# Patient Record
Sex: Male | Born: 1945 | Race: Black or African American | Hispanic: No | Marital: Single | Smoking: Never smoker
Health system: Southern US, Community
[De-identification: ages and names within clinical notes are randomized; demographics above are authoritative.]

## PROBLEM LIST (undated history)

## (undated) HISTORY — PX: ABDOMINAL HERNIA REPAIR: SHX539

---

## 2019-06-18 ENCOUNTER — Ambulatory Visit: Payer: Self-pay

## 2021-03-18 ENCOUNTER — Emergency Department
Admission: EM | Admit: 2021-03-18 | Discharge: 2021-03-18 | Disposition: A | Discharge status: Home / Self Care | Payer: No Typology Code available for payment source | Attending: Emergency Medicine | Admitting: Emergency Medicine

## 2021-03-18 DIAGNOSIS — Y92410 Unspecified street and highway as the place of occurrence of the external cause: Secondary | ICD-10-CM | POA: Diagnosis not present

## 2021-03-18 DIAGNOSIS — S8011XA Contusion of right lower leg, initial encounter: Secondary | ICD-10-CM | POA: Insufficient documentation

## 2021-03-18 DIAGNOSIS — S8991XA Unspecified injury of right lower leg, initial encounter: Secondary | ICD-10-CM | POA: Diagnosis present
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
## 2021-04-05 ENCOUNTER — Emergency Department: Payer: Non-veteran care

## 2021-04-05 ENCOUNTER — Emergency Department
Admission: EM | Admit: 2021-04-05 | Discharge: 2021-04-05 | Disposition: A | Discharge status: Home / Self Care | Payer: Non-veteran care | Attending: Emergency Medicine | Admitting: Emergency Medicine

## 2021-04-05 ENCOUNTER — Other Ambulatory Visit: Payer: Self-pay

## 2021-04-05 DIAGNOSIS — M79604 Pain in right leg: Secondary | ICD-10-CM | POA: Diagnosis not present

## 2021-04-05 DIAGNOSIS — Y92410 Unspecified street and highway as the place of occurrence of the external cause: Secondary | ICD-10-CM | POA: Insufficient documentation

## 2021-04-05 DIAGNOSIS — S39002D Unspecified injury of muscle, fascia and tendon of lower back, subsequent encounter: Secondary | ICD-10-CM | POA: Diagnosis present

## 2021-04-05 DIAGNOSIS — S39012D Strain of muscle, fascia and tendon of lower back, subsequent encounter: Secondary | ICD-10-CM | POA: Insufficient documentation

## 2021-04-05 DIAGNOSIS — S39012A Strain of muscle, fascia and tendon of lower back, initial encounter: Secondary | ICD-10-CM
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
## 2021-04-05 NOTE — ED Triage Notes (Signed)
Pt ambulatory with a steady gait
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
## 2021-04-05 NOTE — ED Notes (Signed)
Patient transported to X-ray 

## 2021-04-12 ENCOUNTER — Emergency Department: Payer: No Typology Code available for payment source

## 2021-04-12 ENCOUNTER — Other Ambulatory Visit: Payer: Self-pay

## 2021-04-12 ENCOUNTER — Encounter: Payer: Self-pay | Admitting: Emergency Medicine

## 2021-04-12 ENCOUNTER — Emergency Department
Admission: EM | Admit: 2021-04-12 | Discharge: 2021-04-12 | Disposition: A | Discharge status: Home / Self Care | Payer: No Typology Code available for payment source | Attending: Emergency Medicine | Admitting: Emergency Medicine

## 2021-04-12 DIAGNOSIS — M545 Low back pain, unspecified: Secondary | ICD-10-CM | POA: Diagnosis not present

## 2021-04-12 DIAGNOSIS — M79604 Pain in right leg: Secondary | ICD-10-CM | POA: Diagnosis not present

## 2021-04-12 DIAGNOSIS — Y92410 Unspecified street and highway as the place of occurrence of the external cause: Secondary | ICD-10-CM | POA: Diagnosis not present

## 2021-04-12 MED ORDER — MUPIROCIN 2 % EX OINT: 1.0000 "application " | TOPICAL_OINTMENT | Freq: Two times a day (BID) | CUTANEOUS | 0 refills | Status: AC

## 2021-04-12 MED ORDER — NAPROXEN 500 MG PO TABS: 500.0000 mg | ORAL_TABLET | Freq: Two times a day (BID) | ORAL | 0 refills | Status: AC

## 2021-04-12 MED ORDER — LIDOCAINE 5 % EX PTCH: 1.0000 | MEDICATED_PATCH | Freq: Two times a day (BID) | CUTANEOUS | 0 refills | Status: AC | PRN
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
## 2022-04-29 ENCOUNTER — Emergency Department: Payer: No Typology Code available for payment source

## 2022-04-29 ENCOUNTER — Emergency Department
Admission: EM | Admit: 2022-04-29 | Discharge: 2022-04-29 | Disposition: A | Discharge status: Home / Self Care | Payer: No Typology Code available for payment source | Attending: Emergency Medicine | Admitting: Emergency Medicine

## 2022-04-29 ENCOUNTER — Other Ambulatory Visit: Payer: Self-pay

## 2022-04-29 DIAGNOSIS — M25561 Pain in right knee: Secondary | ICD-10-CM | POA: Insufficient documentation

## 2022-04-29 DIAGNOSIS — S0993XA Unspecified injury of face, initial encounter: Secondary | ICD-10-CM | POA: Diagnosis present

## 2022-04-29 DIAGNOSIS — M25511 Pain in right shoulder: Secondary | ICD-10-CM | POA: Diagnosis not present

## 2022-04-29 DIAGNOSIS — R519 Headache, unspecified: Secondary | ICD-10-CM | POA: Diagnosis not present

## 2022-04-29 DIAGNOSIS — S01511A Laceration without foreign body of lip, initial encounter: Secondary | ICD-10-CM | POA: Insufficient documentation

## 2022-04-29 DIAGNOSIS — Y92481 Parking lot as the place of occurrence of the external cause: Secondary | ICD-10-CM | POA: Insufficient documentation

## 2022-04-29 MED ORDER — HYDROCODONE-ACETAMINOPHEN 5-325 MG PO TABS: 1.0000 | ORAL_TABLET | Freq: Four times a day (QID) | ORAL | 0 refills | Status: AC | PRN

## 2022-04-29 MED ORDER — LIDOCAINE-EPINEPHRINE-TETRACAINE (LET) TOPICAL GEL
3.0000 mL | Freq: Once | TOPICAL | Status: DC
  Filled 2022-04-29: qty 3

## 2022-04-29 MED ORDER — ONDANSETRON 4 MG PO TBDP: 4.0000 mg | ORAL_TABLET | Freq: Three times a day (TID) | ORAL | 0 refills | Status: AC | PRN

## 2022-04-29 MED ORDER — CEPHALEXIN 500 MG PO CAPS: 500.0000 mg | ORAL_CAPSULE | Freq: Three times a day (TID) | ORAL | 0 refills | Status: AC
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
## 2022-04-29 NOTE — Discharge Instructions (Signed)
You can remove sutures in 5 days.
